# Patient Record
Sex: Male | Born: 1985 | Race: White | Hispanic: No | Marital: Single | Smoking: Former smoker
Health system: Southern US, Community
[De-identification: ages and names within clinical notes are randomized; demographics above are authoritative.]

## PROBLEM LIST (undated history)

## (undated) DIAGNOSIS — T7840XA Allergy, unspecified, initial encounter: Secondary | ICD-10-CM

## (undated) HISTORY — DX: Allergy, unspecified, initial encounter: T78.40XA

---

## 2004-01-13 ENCOUNTER — Emergency Department (HOSPITAL_COMMUNITY): Admission: EM | Admit: 2004-01-13 | Discharge: 2004-01-14 | Payer: Self-pay | Admitting: Emergency Medicine

## 2007-02-03 ENCOUNTER — Encounter: Admission: RE | Admit: 2007-02-03 | Discharge: 2007-02-03 | Payer: Self-pay | Admitting: Family Medicine

## 2011-12-29 ENCOUNTER — Ambulatory Visit (INDEPENDENT_AMBULATORY_CARE_PROVIDER_SITE_OTHER): Payer: BC Managed Care – PPO | Admitting: Family Medicine

## 2011-12-29 VITALS — BP 122/74 | HR 75 | Temp 98.0°F | Resp 16 | Ht 67.0 in | Wt 154.8 lb

## 2011-12-29 DIAGNOSIS — H6090 Unspecified otitis externa, unspecified ear: Secondary | ICD-10-CM

## 2011-12-29 DIAGNOSIS — L0291 Cutaneous abscess, unspecified: Secondary | ICD-10-CM

## 2011-12-29 DIAGNOSIS — M79669 Pain in unspecified lower leg: Secondary | ICD-10-CM

## 2011-12-29 DIAGNOSIS — H60399 Other infective otitis externa, unspecified ear: Secondary | ICD-10-CM

## 2011-12-29 DIAGNOSIS — M79609 Pain in unspecified limb: Secondary | ICD-10-CM

## 2011-12-29 DIAGNOSIS — L0390 Cellulitis, unspecified: Secondary | ICD-10-CM

## 2011-12-29 MED ORDER — OFLOXACIN 0.3 % OT SOLN: 10.0000 [drp] | Freq: Every day | OTIC | Status: AC

## 2011-12-29 MED ORDER — DOXYCYCLINE HYCLATE 100 MG PO CAPS: 100.0000 mg | ORAL_CAPSULE | Freq: Two times a day (BID) | ORAL | Status: AC

## 2011-12-29 NOTE — Progress Notes (Signed)
  Patient Name: Taylor Patton Date of Birth: 02/12/1986 Medical Record Number: [id number] Gender: male Date of Encounter: 12/29/2011  History of Present Illness:  Taylor Patton is a 26 y.o. very pleasant male patient who presents with the following:  Notes a lesion on his right anterior shin- this has been present for the last 2 or 3 days.  It is fairly tender- hurts a little bit to walk. 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  The area has gotten a good bit more painful and larger since yesterday.    Generally healthy except for a history of hearing issues.  He has an rx for ciprodex drops which he uses intermittently.  He feels that his left OE may be acting up.  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  Otherwise he is generally healthy and feels well- no fever, malaise, cough, ST, etc   There is no problem list on file for this patient.  No past medical history on file. No past surgical history on file. History  Substance Use Topics  . Smoking status: Former Games developer  . Smokeless tobacco: Not on file  . Alcohol Use: Not on file   No family history on file. No Known Allergies  Medication list has been reviewed and updated.  Review of Systems: As per HPI- otherwise negative.   Physical Examination: Filed Vitals:   12/29/11 1250  BP: 122/74  Pulse: 75  Temp: 98 F (36.7 C)  TempSrc: Oral  Resp: 16  Height: 5\' 7"  (1.702 m)  Weight: 154 lb 12.8 oz (70.217 kg)    Body mass index is 24.25 kg/(m^2).  Physical exam: GEN: WDWN, NAD, Non-toxic, A & O x 3 HEENT: Atraumatic, Normocephalic. Neck supple. No masses, No LAD.  Right ear/ TM wnl.  Left ear canal with debris and swelling which obscures the TM- however I do not see definite OM.  Ears and Nose: No external deformity. Respiratory effort and rate normal, pulse normal EXTR: No c/c/e NEURO Normal gait.  PSYCH: Normally  interactive. Conversant. Not depressed or anxious appearing.  Calm demeanor.  Left anterior shin: there is a nickel sized area on the medial side of the tibia- is is red and tender.  There is a small "head" but no pustule, no fluctuance.  Do not suspect an abscess that needs I and D at this time  Assessment and plan:  1. Cellulitis  doxycycline (VIBRAMYCIN) 100 MG capsule  2. Otitis externa  ofloxacin (FLOXIN OTIC) 0.3 % otic solution  3. Pain, lower leg  doxycycline (VIBRAMYCIN) 100 MG capsule   Explained that he likely has MRSA due to his time spent at a gym and the appearance/ behavior of his leg cellulitis.  Cover with doxy, use hot compresses. If not getting better please RTC as may need I and D.   floxin otic drops to use for one week for left OE.  Patient (or parent if minor) instructed to return to clinic or call if not better in 2 day(s).  Sooner if worse.

## 2012-01-01 ENCOUNTER — Telehealth: Payer: Self-pay

## 2012-01-01 NOTE — Telephone Encounter (Signed)
PT STATES THAT HE WAS DIAGNOSED WITH HAVING MRSA AND WAS GIVEN AN ANTIBIOTIC, SINCE THEN THE AREA HAS BEEN GETTING BETTER BUT HE IS NOW CONCERNED ABOUT A LYMPH NODE THAT HE BELIEVES IS INFLAMED IN HIS GROIN AREA AND WOULD LIKE TO KNOW IF ITS CAUSED BY HAVING MRSA.

## 2012-01-01 NOTE — Telephone Encounter (Signed)
That is your body's natural infection fighting response.

## 2012-01-01 NOTE — Telephone Encounter (Signed)
INFORMED PT OF MESSAGE. PT FEELS HE IS GETTING BETTER. HE IS TAKING THE ABX AND TOLD HIM TO RTC IF GETS ANY WORSE.

## 2012-01-03 ENCOUNTER — Ambulatory Visit (INDEPENDENT_AMBULATORY_CARE_PROVIDER_SITE_OTHER): Payer: BC Managed Care – PPO | Admitting: Internal Medicine

## 2012-01-03 VITALS — BP 115/65 | HR 65 | Temp 98.3°F | Resp 16 | Ht 66.5 in | Wt 158.8 lb

## 2012-01-03 DIAGNOSIS — L0390 Cellulitis, unspecified: Secondary | ICD-10-CM

## 2012-01-03 DIAGNOSIS — S81009A Unspecified open wound, unspecified knee, initial encounter: Secondary | ICD-10-CM

## 2012-01-03 DIAGNOSIS — R599 Enlarged lymph nodes, unspecified: Secondary | ICD-10-CM

## 2012-01-03 DIAGNOSIS — L089 Local infection of the skin and subcutaneous tissue, unspecified: Secondary | ICD-10-CM

## 2012-01-03 DIAGNOSIS — R591 Generalized enlarged lymph nodes: Secondary | ICD-10-CM

## 2012-01-03 NOTE — Progress Notes (Signed)
   Patient ID: Taylor Patton MRN: [id number], DOB: Jul 18, 1986, 26 y.o. Date of Encounter: 01/03/2012, 12:35 PM    PROCEDURE NOTE: Verbal consent obtained. Betadine prep per usual protocol. Local anesthesia obtained with 1% lidocaine with epi 2 cc.  1cm incision made with 11 blade along lesion.  Culture taken previously by Dr. Vic Ripper amount of purulence expressed. Wound debrided. Lesion explored revealing no loculations. Irrigated with NS Packed with 1/4 inch plain packing Dressed. Wound care instructions including precautions with patient. Patient tolerated the procedure well. Recheck in 48 hours.     SignedEula Listen, PA-C 01/03/2012 12:35 PM

## 2012-01-03 NOTE — Progress Notes (Signed)
  Subjective:    Patient ID: Taylor Patton, male    DOB: 1986-06-19, 26 y.o.   MRN: [id number]  HPI Pain and drainage from infected spider or insect bite here for followup feels less ill/ no fever or chills /no nausea or vomiting /but feels continued pain 5/10. Has continued drainage and swelling of the lower leg with drainage form the wound site.also has swollen lymph nodes in his groin. Compliant with the antibiotic taking doxycycline   Review of Systems  Constitutional: Negative.   HENT: Negative.   Skin: Positive for wound.       Left anterior leg  All other systems reviewed and are negative.       Objective:   Physical Exam  Nursing note and vitals reviewed. Constitutional: He is oriented to person, place, and time. He appears well-developed and well-nourished.  HENT:  Head: Normocephalic and atraumatic.  Right Ear: External ear normal.  Left Ear: External ear normal.  Nose: Nose normal.  Mouth/Throat: Oropharynx is clear and moist.  Eyes: Conjunctivae and EOM are normal. Pupils are equal, round, and reactive to light.  Neck: Normal range of motion. Neck supple.  Cardiovascular: Normal rate and regular rhythm.   Pulmonary/Chest: Effort normal and breath sounds normal.  Abdominal: Soft.  Musculoskeletal: He exhibits tenderness.       Swelling tenderness of the right lower leg anterior tibial area mid leg  Neurological: He is alert and oriented to person, place, and time. He has normal reflexes.  Skin:       3cm area of induratoin and erythema with a central area of necrosis with puss coming from the central area. There is dead skin in the surrounding area and induration and fluctuance of the central are. Small amount of pus expressed and culture done.  Psychiatric: He has a normal mood and affect. His behavior is normal. Judgment and thought content normal.          Assessment & Plan:  Infected bite suspect spider bite  With central area of necrosis. Plan i and d  and cultures. Continue antibiotic and analgesics.

## 2012-01-03 NOTE — Patient Instructions (Signed)
Warm compresses. Dressing changes as directed. Continue and complete the antibiotics. otc Advil as directed for pain.

## 2012-01-05 ENCOUNTER — Ambulatory Visit (INDEPENDENT_AMBULATORY_CARE_PROVIDER_SITE_OTHER): Payer: BC Managed Care – PPO | Admitting: Physician Assistant

## 2012-01-05 VITALS — BP 106/70 | HR 72 | Temp 98.6°F | Resp 16 | Ht 66.25 in | Wt 156.0 lb

## 2012-01-05 DIAGNOSIS — L0390 Cellulitis, unspecified: Secondary | ICD-10-CM

## 2012-01-05 DIAGNOSIS — L0291 Cutaneous abscess, unspecified: Secondary | ICD-10-CM

## 2012-01-05 NOTE — Progress Notes (Signed)
  Subjective:    Patient ID: Taylor Patton, male    DOB: [birth year], 26 y.o.   MRN: [id number]  HPI Taylor Patton comes in today for wound recheck s/p I & D, right anterior lower leg on 01/03/12.  He is almost finished with the antibiotic and is tolerating it well.  He attempted to remove the bandage and saw that the packing was coming out so he stopped.  He has no increased in pain, fever or chills are new redness or swelling.     Review of Systems As above     Objective:   Physical Exam Right lower leg  Anterior aspect with intact packing which is removed easily.  Some purulent drainage noted on gauze.  Tender along incision borders but no active drainage noted although it remains indurated.  Irrigated with Lidocaine 2% plain 3 cc.  Repacked with 1/4" plain.  Cleansed and dressed.       Assessment & Plan:  Abscess, preliminary culture report shows moderate staph aureus  Continue antibiotic.  Reassess need to extend abx at next wound recheck on Friday.  Wound care reviewed.  He will return in 48 hours for wound recheck.

## 2012-01-06 LAB — WOUND CULTURE: Gram Stain: NONE SEEN

## 2012-01-07 ENCOUNTER — Ambulatory Visit (INDEPENDENT_AMBULATORY_CARE_PROVIDER_SITE_OTHER): Payer: BC Managed Care – PPO | Admitting: Physician Assistant

## 2012-01-07 ENCOUNTER — Telehealth: Payer: Self-pay

## 2012-01-07 ENCOUNTER — Encounter: Payer: Self-pay | Admitting: Physician Assistant

## 2012-01-07 VITALS — BP 126/78 | HR 76 | Temp 98.6°F | Resp 16 | Ht 66.5 in | Wt 156.0 lb

## 2012-01-07 DIAGNOSIS — L02419 Cutaneous abscess of limb, unspecified: Secondary | ICD-10-CM

## 2012-01-07 DIAGNOSIS — L03119 Cellulitis of unspecified part of limb: Secondary | ICD-10-CM

## 2012-01-07 NOTE — Progress Notes (Signed)
   Patient ID: Taylor Patton MRN: [id number], DOB: 25-Feb-1986 26 y.o. Date of Encounter: 01/07/2012, 1:39 PM  Primary Physician: No primary provider on file.  Chief Complaint: Wound care   See previous note  HPI: 26 y.o. y/o male presents for wound care s/p I&D on 01/03/12. Doing well No issues or complaints Afebrile/ no chills No nausea or vomiting Tolerating Doxycycline Pain improving Daily dressing change Previous note reviewed  No past medical history on file.   Home Meds: Prior to Admission medications   Medication Sig Start Date End Date Taking? Authorizing Provider  doxycycline (VIBRAMYCIN) 100 MG capsule Take 1 capsule (100 mg total) by mouth 2 (two) times daily. 12/29/11 01/08/12 Yes Jessica C Copland, MD  ofloxacin (FLOXIN OTIC) 0.3 % otic solution Place 10 drops into the right ear daily. 12/29/11 01/08/12 Yes Gwenlyn Found Copland, MD    Allergies: No Known Allergies  ROS: Constitutional: Afebrile, no chills Cardiovascular: negative for chest pain or palpitations Dermatological: Positive for wound. Negative for erythema, or warmth  GI: No nausea or vomiting   EXAM: Physical Exam: Blood pressure 126/78, pulse 76, temperature 98.6 F (37 C), temperature source Oral, resp. rate 16, height 5' 6.5" (1.689 m), weight 156 lb (70.761 kg), SpO2 100.00%., Body mass index is 24.80 kg/(m^2). General: Well developed, well nourished, in no acute distress. Nontoxic appearing. Head: Normocephalic, atraumatic, sclera non-icteric.  Neck: Supple. Lungs: Breathing is unlabored. Heart: Normal rate. Skin:  Warm and moist. Dressing and packing in place. Mild local induration. No erythema, or tenderness to palpation. Neuro: Alert and oriented X 3. Moves all extremities spontaneously. Normal gait.  Psych:  Responds to questions appropriately with a normal affect.   PROCEDURE: Dressing and packing removed. No purulence expressed Wound bed healthy Irrigated with 1% plain lidocaine 5  cc. Repacked with 1/4 inch plain packing Dressing applied  LAB: Culture: MRSA  A/P: 26 y.o. y/o male with improving cellulitis/abscess as above s/p I&D on 01/03/12 -Wound care per above -Continue Doxycycline -Pain well controlled -Daily dressing changes -Recheck 48 hours  Signed, Eula Listen, PA-C 01/07/2012 1:39 PM

## 2012-01-07 NOTE — Telephone Encounter (Signed)
Taylor Patton  Pt will run out of antibiotics prior to returning on Sunday.  Does he need to continue taking them?  Please call patient

## 2012-01-08 NOTE — Telephone Encounter (Signed)
[facility] message from [place]

## 2012-01-08 NOTE — Telephone Encounter (Signed)
As long as he is still improving, it is ok for him to be finished with them and we can assess the need for more tomorrow

## 2012-01-09 ENCOUNTER — Ambulatory Visit (INDEPENDENT_AMBULATORY_CARE_PROVIDER_SITE_OTHER): Payer: BC Managed Care – PPO | Admitting: Family Medicine

## 2012-01-09 VITALS — BP 114/66 | HR 84 | Temp 97.9°F | Resp 16 | Ht 66.0 in | Wt 154.8 lb

## 2012-01-09 DIAGNOSIS — L03119 Cellulitis of unspecified part of limb: Secondary | ICD-10-CM

## 2012-01-09 DIAGNOSIS — L02415 Cutaneous abscess of right lower limb: Secondary | ICD-10-CM

## 2012-01-09 DIAGNOSIS — L02419 Cutaneous abscess of limb, unspecified: Secondary | ICD-10-CM

## 2012-01-09 NOTE — Progress Notes (Signed)
26 year old man here for followup of right shin abscess.  Objective: The wound is now nontender with a 4 mm opening, clean base, and no surrounding cellulitis.   assessment wound is healing nicely  Plan: Finish doxycycline continue wash with soap and water and cover his lungs there is an open sore.

## 2012-01-11 ENCOUNTER — Ambulatory Visit (INDEPENDENT_AMBULATORY_CARE_PROVIDER_SITE_OTHER): Payer: BC Managed Care – PPO | Admitting: Physician Assistant

## 2012-01-11 VITALS — BP 111/66 | HR 69 | Temp 98.4°F | Resp 18 | Ht 66.5 in | Wt 152.2 lb

## 2012-01-11 DIAGNOSIS — L03119 Cellulitis of unspecified part of limb: Secondary | ICD-10-CM

## 2012-01-11 DIAGNOSIS — L02419 Cutaneous abscess of limb, unspecified: Secondary | ICD-10-CM

## 2012-01-11 NOTE — Progress Notes (Signed)
   Patient ID: Taylor Patton MRN: [id number], DOB: 12-27-1985 26 y.o. Date of Encounter: 01/11/2012, 2:33 PM  Primary Physician: No primary provider on file.  Chief Complaint: Wound care   See previous note  HPI: 26 y.o. y/o male presents for wound care s/p I&D on 01/03/12 Doing well No issues or complaints Afebrile/ no chills No nausea or vomiting Tolerating Doxycycline No pain Daily dressing change Previous note reviewed Told at last office visit that wound was resolved, patient concerned as there still appears to be some mild swelling.   No past medical history on file.   Home Meds: Prior to Admission medications   Medication Sig Start Date End Date Taking? Authorizing Provider  doxycycline (VIBRAMYCIN) 100 MG capsule Take 100 mg by mouth 2 (two) times daily.   Yes Historical Provider, MD  ofloxacin (FLOXIN) 0.3 % otic solution Place 5 drops into the left ear daily.    Historical Provider, MD    Allergies: No Known Allergies  ROS: Constitutional: Afebrile, no chills Cardiovascular: negative for chest pain or palpitations Dermatological: Positive for wound. Negative for erythema, pain, or warmth  GI: No nausea or vomiting   EXAM: Physical Exam: Blood pressure 111/66, pulse 69, temperature 98.4 F (36.9 C), temperature source Oral, resp. rate 18, height 5' 6.5" (1.689 m), weight 152 lb 3.2 oz (69.037 kg), SpO2 99.00%., Body mass index is 24.20 kg/(m^2). General: Well developed, well nourished, in no acute distress. Nontoxic appearing. Head: Normocephalic, atraumatic, sclera non-icteric.  Neck: Supple. Lungs: Breathing is unlabored. Heart: Normal rate. Skin:  Warm and moist. Dressing and packing in place. Mild local induration, without fluctuance, erythema, or tenderness to palpation. Neuro: Alert and oriented X 3. Moves all extremities spontaneously. Normal gait.  Psych:  Responds to questions appropriately with a normal affect.    LAB: Culture:  MRSA  A/P: 26 y.o. y/o male with resolving cellulitis/abscess as above s/p I&D on 01/03/12 -Wound care per above -Continue Doxycycline -No pain -Daily dressing changes -Recheck 3-4 days -Warm compresses -RTC precautions  Signed, Eula Listen, PA-C 01/11/2012 2:33 PM

## 2012-01-14 ENCOUNTER — Ambulatory Visit (INDEPENDENT_AMBULATORY_CARE_PROVIDER_SITE_OTHER): Payer: BC Managed Care – PPO | Admitting: Physician Assistant

## 2012-01-14 VITALS — BP 127/74 | HR 69 | Temp 98.0°F | Resp 16 | Ht 66.5 in | Wt 152.8 lb

## 2012-01-14 DIAGNOSIS — J309 Allergic rhinitis, unspecified: Secondary | ICD-10-CM

## 2012-01-14 DIAGNOSIS — L03119 Cellulitis of unspecified part of limb: Secondary | ICD-10-CM

## 2012-01-14 MED ORDER — MOMETASONE FUROATE 50 MCG/ACT NA SUSP: 2.0000 | Freq: Every day | NASAL | Status: AC

## 2012-01-14 MED ORDER — DOXYCYCLINE HYCLATE 100 MG PO CAPS: 100.0000 mg | ORAL_CAPSULE | Freq: Two times a day (BID) | ORAL | Status: AC

## 2012-01-14 NOTE — Progress Notes (Signed)
   Patient ID: Taylor Patton MRN: [id number], DOB: 06/04/1986 26 y.o. Date of Encounter: 01/14/2012, 1:40 PM  Primary Physician: No primary provider on file.  Chief Complaint: Wound care   See previous note  HPI: 26 y.o. y/o male presents for wound care s/p I&D on 01/03/12 Doing well No issues or complaints Afebrile/ no chills No nausea or vomiting Tolerating Doxycycline Pain resolved Previous note reviewed  He also mentions worsening of his allergies over the past couple of weeks. Restarted his Claritin 5 days prior and requests some help with this today.  No past medical history on file.   Home Meds: Prior to Admission medications   Medication Sig Start Date End Date Taking? Authorizing Provider  doxycycline (VIBRAMYCIN) 100 MG capsule Take 100 mg by mouth 2 (two) times daily.   Yes Historical Provider, MD  loratadine (CLARITIN) 10 MG tablet Take 10 mg by mouth daily.   Yes Historical Provider, MD  doxycycline (VIBRAMYCIN) 100 MG capsule Take 1 capsule (100 mg total) by mouth 2 (two) times daily. 01/14/12 01/24/12  Jermain Curt M Alizandra Loh, PA-C  mometasone (NASONEX) 50 MCG/ACT nasal spray Place 2 sprays into the nose daily. 01/14/12 01/13/13  Lister Brizzi M Kashira Behunin, PA-C  ofloxacin (FLOXIN) 0.3 % otic solution Place 5 drops into the left ear daily.    Historical Provider, MD    Allergies: No Known Allergies  ROS: Constitutional: Afebrile, no chills Cardiovascular: negative for chest pain or palpitations Dermatological: Positive for wound. Negative for erythema, pain, or warmth  GI: No nausea or vomiting   EXAM: Physical Exam: Blood pressure 127/74, pulse 69, temperature 98 F (36.7 C), temperature source Oral, resp. rate 16, height 5' 6.5" (1.689 m), weight 152 lb 12.8 oz (69.31 kg), SpO2 100.00%., Body mass index is 24.29 kg/(m^2). General: Well developed, well nourished, in no acute distress. Nontoxic appearing. Head: Normocephalic, atraumatic, sclera non-icteric.  Neck: Supple. Lungs:  Breathing is unlabored. Heart: Normal rate. Skin:  Warm and moist. Dressing and packing in place. No induration, erythema, or tenderness to palpation. Neuro: Alert and oriented X 3. Moves all extremities spontaneously. Normal gait.  Psych:  Responds to questions appropriately with a normal affect.    LAB: Culture: MRSA  A/P: 26 y.o. y/o male with resolved cellulitis/abscess as above s/p I&D on 01/03/12 and allergic rhinitis -Doxycycline 100 mg 1 po bid 320 no RF -No pain -Resolved -Nasonex #1 As directed RF 12  Signed, Eula Listen, PA-C 01/14/2012 1:40 PM

## 2012-01-25 NOTE — Telephone Encounter (Signed)
PT STATES HE HAD CALLED FOR RYAN ABOUT THE MRSA HE WAS DIAGNOSED WITH AND HADN'T HEARD FROM HIM. PT WAS TOLD THAT RYAN WILL BE IN ON Thursday AND HE WANTED TO WAIT AND RECEIVE A CALL FROM RYAN ONLY. PLEASE CALL [phone]

## 2012-01-26 NOTE — Telephone Encounter (Signed)
LMOM

## 2012-01-26 NOTE — Telephone Encounter (Signed)
Spoke with patient. Had a question about small amount of firmness. Advised continue warm compresses and call back in 10 days if no better.

## 2012-03-04 ENCOUNTER — Ambulatory Visit (INDEPENDENT_AMBULATORY_CARE_PROVIDER_SITE_OTHER): Payer: BC Managed Care – PPO | Admitting: Emergency Medicine

## 2012-03-04 VITALS — BP 118/84 | HR 67 | Temp 97.6°F | Resp 16 | Ht 66.5 in | Wt 156.6 lb

## 2012-03-04 DIAGNOSIS — H66009 Acute suppurative otitis media without spontaneous rupture of ear drum, unspecified ear: Secondary | ICD-10-CM

## 2012-03-04 DIAGNOSIS — H6693 Otitis media, unspecified, bilateral: Secondary | ICD-10-CM

## 2012-03-04 NOTE — Progress Notes (Signed)
  Subjective:    Patient ID: Taylor Patton, male    DOB: 07/08/1986, 26 y.o.   MRN: [id number]  Otalgia  There is pain in the left ear. This is a recurrent problem. The current episode started in the past 7 days. The problem occurs constantly. The problem has been unchanged. There has been no fever. The pain is mild. Associated symptoms include hearing loss. Pertinent negatives include no abdominal pain, coughing, diarrhea, drainage, ear discharge, headaches, neck pain, rash, rhinorrhea, sore throat or vomiting. He has tried nothing for the symptoms. His past medical history is significant for hearing loss and a tympanostomy tube. There is no history of a chronic ear infection.      Review of Systems  HENT: Positive for hearing loss and ear pain. Negative for sore throat, rhinorrhea, neck pain and ear discharge.   Respiratory: Negative for cough.   Gastrointestinal: Negative for vomiting, abdominal pain and diarrhea.  Skin: Negative for rash.  Neurological: Negative for headaches.  All other systems reviewed and are negative.       Objective:   Physical Exam  Constitutional: He appears well-developed and well-nourished.  HENT:  Head: Normocephalic and atraumatic.  Right Ear: Tympanic membrane is perforated.  Left Ear: Tympanic membrane is perforated.  Eyes: Conjunctivae are normal. Pupils are equal, round, and reactive to light.  Neck: Normal range of motion. Neck supple.  Cardiovascular: Normal rate.   Pulmonary/Chest: Effort normal.  Abdominal: Soft.          Assessment & Plan:  Bilateral pe tubes Concern about motion sickness drammamine

## 2012-09-17 ENCOUNTER — Ambulatory Visit: Payer: BC Managed Care – PPO

## 2012-09-17 ENCOUNTER — Ambulatory Visit (INDEPENDENT_AMBULATORY_CARE_PROVIDER_SITE_OTHER): Payer: BC Managed Care – PPO | Admitting: Family Medicine

## 2012-09-17 VITALS — BP 115/75 | HR 77 | Temp 98.5°F | Resp 17 | Ht 66.5 in | Wt 159.0 lb

## 2012-09-17 DIAGNOSIS — R0789 Other chest pain: Secondary | ICD-10-CM

## 2012-09-17 DIAGNOSIS — R071 Chest pain on breathing: Secondary | ICD-10-CM

## 2012-09-17 MED ORDER — HYDROCODONE-ACETAMINOPHEN 5-300 MG PO TABS: ORAL_TABLET | ORAL | Status: AC

## 2012-09-17 NOTE — Patient Instructions (Signed)
rest your chest wall until it is feeling better. If things are getting worse, return. Things to watch out for would include coughing up blood or increased shortness of breath, or accelerating pain. He is to return here or go to the emergency room.

## 2012-09-17 NOTE — Progress Notes (Addendum)
Subjective: 27 year old who was doing some things in his gym today. He is feeling jumping up onto some boxes. He is done the maneuver safely other times, but this time he didn't didn't land securely and fell about 5 feet. He landed on his left rib cage laterally, also hitting his knee some. He is already prior to this been having problems with his left arm, with a reticular pain and numbness in his thumb and index finger on the left side. He has seen a chiropractor friend who has x-rays neck. They felt like it was probably bones pinching some on the nerve to irritated. Did not feel like the disc look at. The chiropractor's been doing some work with him. Not on any regular medications for it or any other regular meds except allergy meds.  Objective: Alert oriented young man in no major distress. He has not had any hemoptysis. He does have some discomfort when he breathes. His chest is clear to auscultation. Heart regular. His lateral lower rib cage is tender. He felt like it seemed a little out of line on the far lateral aspect at about T. 10, but I do not feel any obvious bony abnormality. He is tender. Most of the tenderness is at the distal end of the lower ribs. His grip seems good, and function of the arm is normal. He doesn't seem to have is much of the radial nerve symptoms today.  Assessment: Chest wall pain Left radial neuropathy  Plan: Left rib series, then decide treatment  [facility] reading (PRIMARY) by  Dr. Alwyn Ren No fracture seen .

## 2013-08-06 ENCOUNTER — Ambulatory Visit (INDEPENDENT_AMBULATORY_CARE_PROVIDER_SITE_OTHER): Payer: BC Managed Care – PPO | Admitting: Sports Medicine

## 2013-08-06 ENCOUNTER — Encounter: Payer: Self-pay | Admitting: Sports Medicine

## 2013-08-06 VITALS — BP 120/74 | Ht 66.5 in | Wt 153.0 lb

## 2013-08-06 DIAGNOSIS — M25519 Pain in unspecified shoulder: Secondary | ICD-10-CM

## 2013-08-06 DIAGNOSIS — M25512 Pain in left shoulder: Secondary | ICD-10-CM

## 2013-08-06 NOTE — Progress Notes (Signed)
   Subjective:    Patient ID: Taylor Patton, male    DOB: 1986-06-01, 27 y.o.   MRN: [id number]  HPI  Pt presents to clinic for evaluation of Lt shoulder pain x 1 yr.  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   Initially had tingling down lt arm and pain, tingling subsided about 9 months ago.   Seeing Damien Rudolofo for ART. Trying home stretching recommended by [facility], and massage. He has also used acupuncture Still doing daily crossfit workouts, although most upper body exercises cause shoulder pain.    He notes that the tingling he had in his left arm has resolved with treatment However, he still has specific week positions for the shoulder Ultimately if he tries certain overhead lifts  Review of Systems     Objective:   Physical Exam Muscular white male in no acute distress  Lt lower rib cage protrudes more than rt Lt arm slightly smaller than rt  L > R scapular winging on back scratch Lt scapular winging on wall push up   Lt shoulder exam: Empty can negative  Weak on O'Brien's and pain Clicking on crank test, more stable with assistance  Speeds test and rotated biceps stress test both reveal weakness and significant pain       Assessment & Plan:

## 2013-08-06 NOTE — Assessment & Plan Note (Signed)
I strongly suspect he has a SLAP tear  At 27 and with his occupation as a Marketing executive I think is a good candidate for repair if that is the case  I will send him to Dr. Dion Saucier for an opinion and probable MRA

## 2013-08-06 NOTE — Patient Instructions (Addendum)
Your exam is positive for superior labral tear in your left shoulder  You have been scheduled for an appointment with Dr. Dion Saucier at [facility] on 08/07/13 at 2pm  Their office is located at [address]                                          [place], [id number]  [phone]

## 2013-08-07 ENCOUNTER — Other Ambulatory Visit: Payer: Self-pay | Admitting: Orthopedic Surgery

## 2013-08-07 DIAGNOSIS — M25511 Pain in right shoulder: Secondary | ICD-10-CM

## 2013-08-21 ENCOUNTER — Other Ambulatory Visit: Payer: Self-pay | Admitting: Orthopedic Surgery

## 2013-08-21 DIAGNOSIS — M25512 Pain in left shoulder: Secondary | ICD-10-CM

## 2013-08-22 ENCOUNTER — Ambulatory Visit
Admission: RE | Admit: 2013-08-22 | Discharge: 2013-08-22 | Disposition: A | Discharge status: Home / Self Care | Payer: BC Managed Care – PPO | Source: Ambulatory Visit | Attending: Orthopedic Surgery | Admitting: Orthopedic Surgery

## 2013-08-22 ENCOUNTER — Other Ambulatory Visit: Payer: Self-pay | Admitting: Orthopedic Surgery

## 2013-08-22 DIAGNOSIS — M25512 Pain in left shoulder: Secondary | ICD-10-CM

## 2013-08-22 DIAGNOSIS — M25511 Pain in right shoulder: Secondary | ICD-10-CM

## 2013-08-22 MED ORDER — IOHEXOL 180 MG/ML  SOLN
15.0000 mL | Freq: Once | INTRAMUSCULAR | Status: AC | PRN
  Administered 2013-08-22: 15 mL via INTRA_ARTICULAR

## 2017-06-28 ENCOUNTER — Other Ambulatory Visit: Payer: Self-pay | Admitting: Otolaryngology

## 2017-06-28 DIAGNOSIS — H7190 Unspecified cholesteatoma, unspecified ear: Secondary | ICD-10-CM

## 2018-10-04 ENCOUNTER — Other Ambulatory Visit: Payer: Self-pay | Admitting: Otolaryngology

## 2018-10-04 DIAGNOSIS — H7102 Cholesteatoma of attic, left ear: Secondary | ICD-10-CM

## 2018-10-19 ENCOUNTER — Inpatient Hospital Stay: Admission: RE | Admit: 2018-10-19 | Payer: Self-pay | Source: Ambulatory Visit

## 2018-10-19 ENCOUNTER — Ambulatory Visit
Admission: RE | Admit: 2018-10-19 | Discharge: 2018-10-19 | Disposition: A | Discharge status: Home / Self Care | Payer: BLUE CROSS/BLUE SHIELD | Source: Ambulatory Visit | Attending: Otolaryngology | Admitting: Otolaryngology

## 2018-10-19 DIAGNOSIS — H7102 Cholesteatoma of attic, left ear: Secondary | ICD-10-CM

## 2019-11-27 IMAGING — CT CT TEMPORAL BONES W/O CM
1 series · 14 of 30 positions shown, 18 images · non-contrast
Comparison: Brain MRI 02/03/2007.

CLINICAL DATA: 32-year-old male with progressive chronic left side
hearing loss. Cholesteatoma of the left addict. History of
tympanostomy tubes.

EXAM:
CT TEMPORAL BONES WITHOUT CONTRAST
TECHNIQUE: Axial and coronal plane CT imaging of the petrous temporal bones was
performed with thin-collimation image reconstruction. No intravenous
contrast was administered. Multiplanar CT image reconstructions were
also generated.

[Series 4: soft tissue · axial · 0.38mm/px · z∈[-162,-82]mm · 14 of 46 slices shown, 18 images]
[im 4/46  brain]
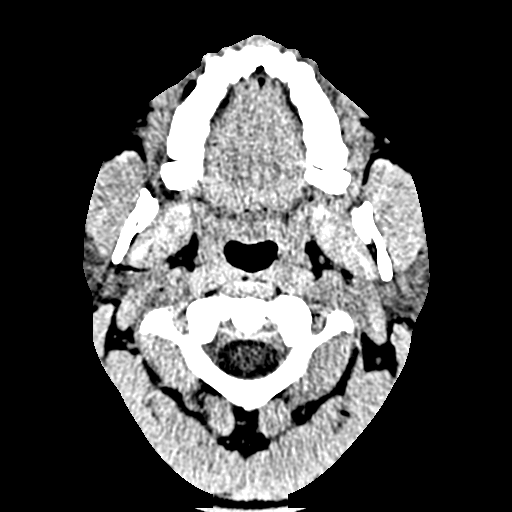
[im 4/46  bone]
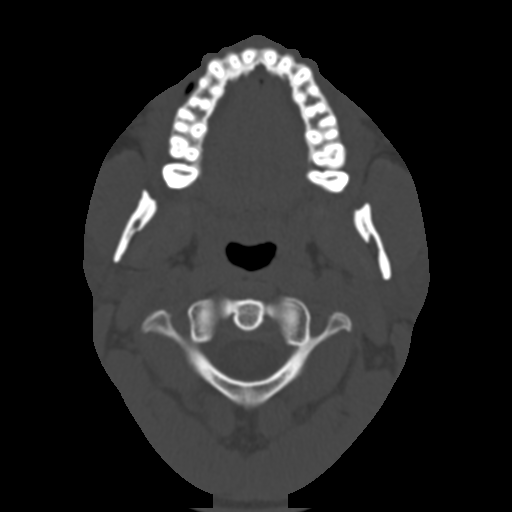
[im 7/46  bone]
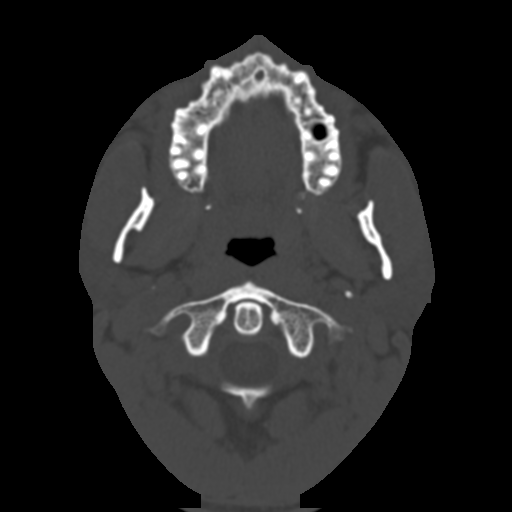
[im 10/46  bone]
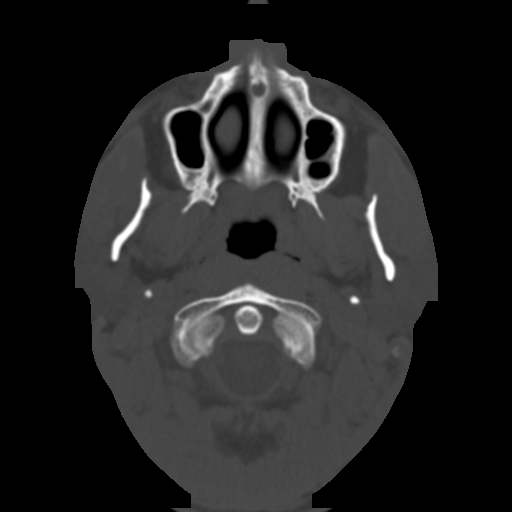
[im 13/46  bone]
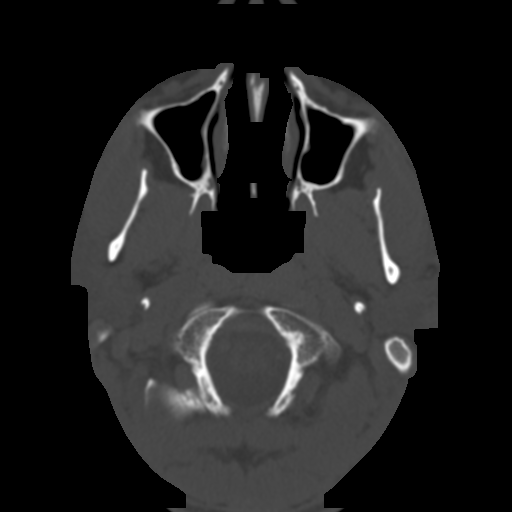
[im 16/46  brain]
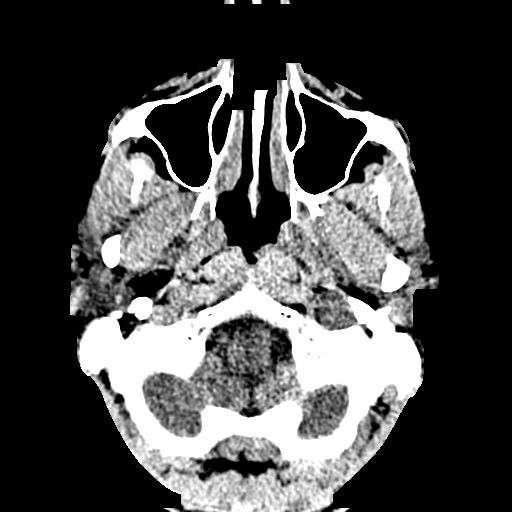
[im 16/46  bone]
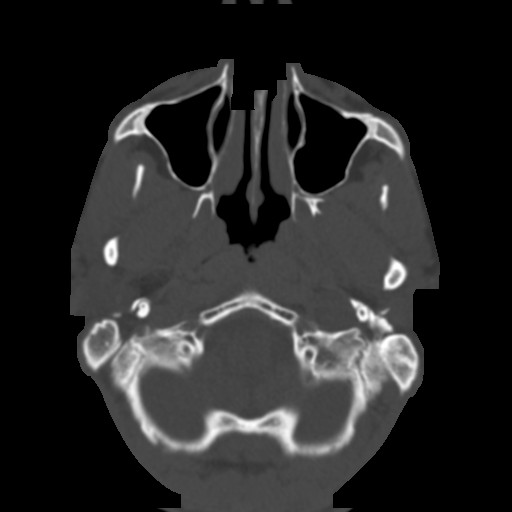
[im 19/46  bone]
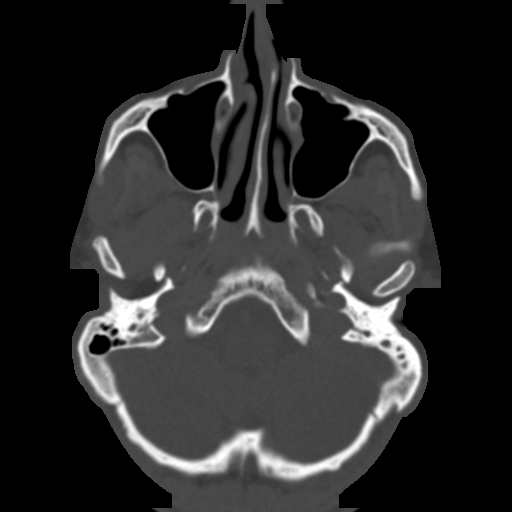
[im 22/46  bone]
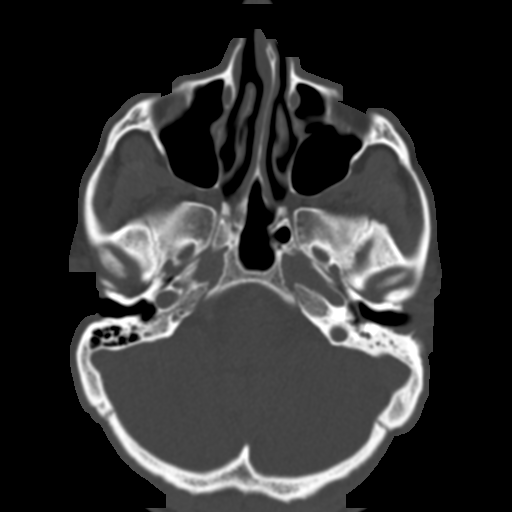
[im 25/46  bone]
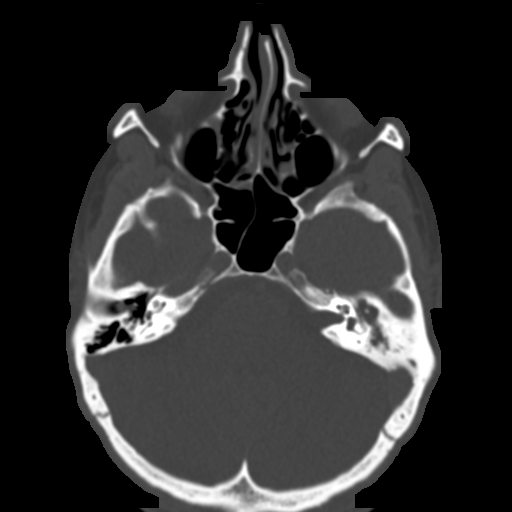
[im 28/46  brain]
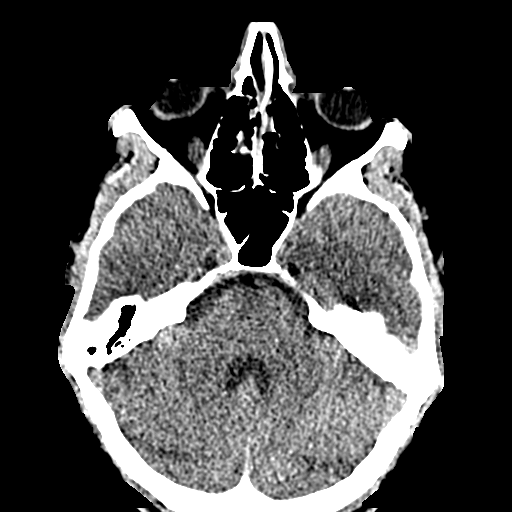
[im 28/46  bone]
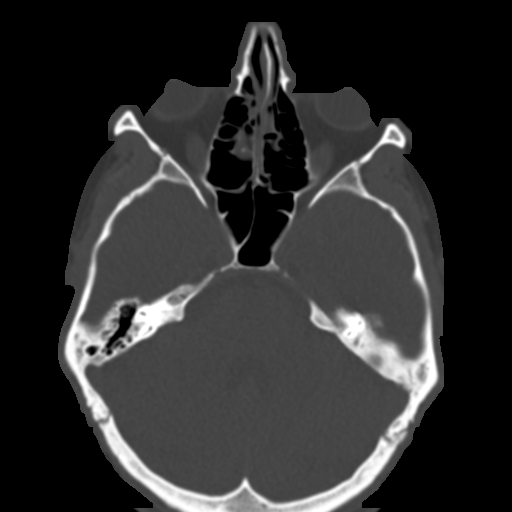
[im 32/46  bone]
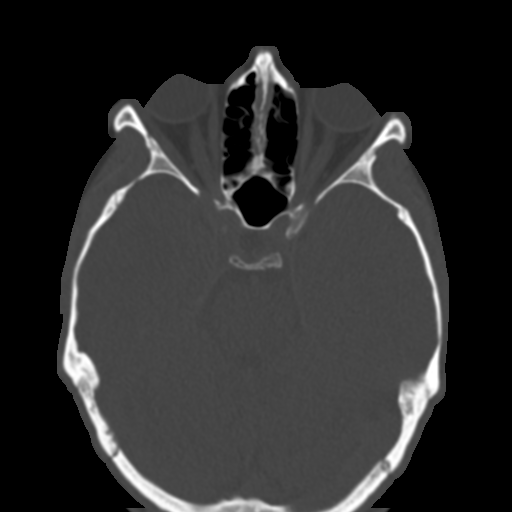
[im 35/46  bone]
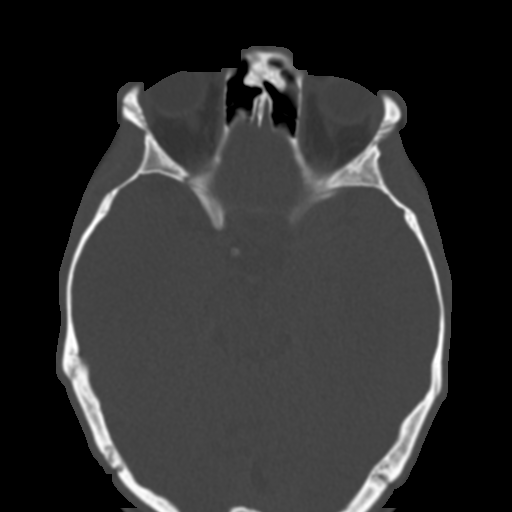
[im 38/46  bone]
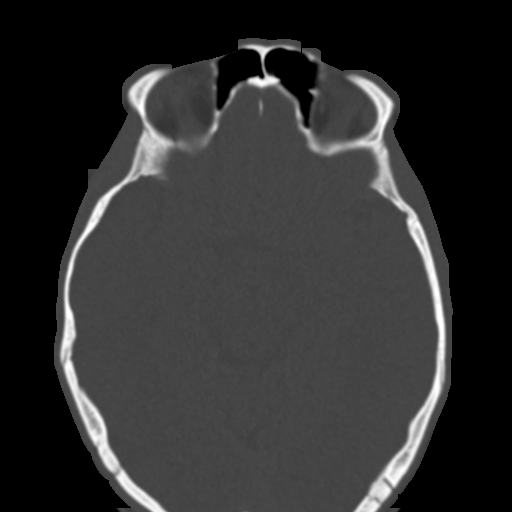
[im 41/46  brain]
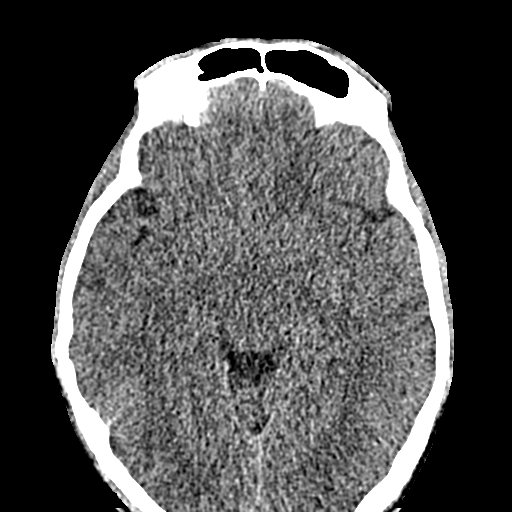
[im 41/46  bone]
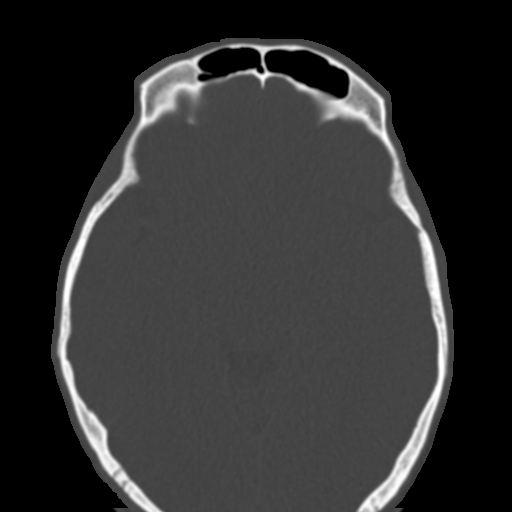
[im 44/46  bone]
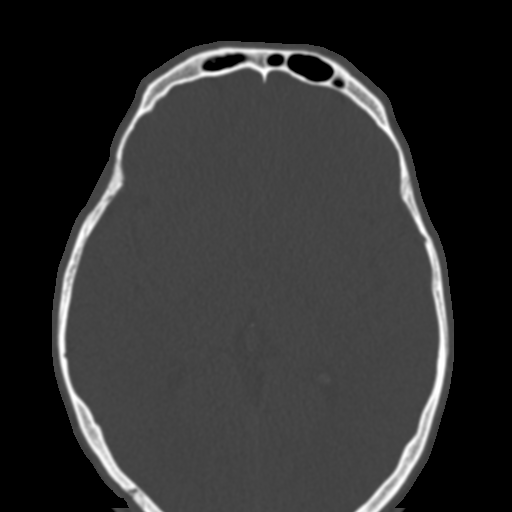

[14 of 30 positions shown; findings below may reference images not displayed]

FINDINGS: Stable and negative visible noncontrast brain parenchyma.

Visualized orbits and scalp soft tissues are within normal limits.
Negative visible noncontrast deep soft tissue spaces of the face.

Scattered mild bilateral paranasal sinus mucosal thickening
primarily involves the ethmoids and is mildly progressed compared to
5778. No sinus fluid level.

In general skull base mineralization is normal.

RIGHT TEMPORAL BONE:

Spanning both the medial aspect of the right EAC, the superior
tympanic membrane, and occupying left Prussak's space is a stellate
and globular opacity best demonstrated on series 9, image 100
encompassing about 8-9 millimeters. This surrounds the scutum and
abuts the ossicles with suspected mild bony erosion. Axial images
demonstrate the globular opacity continues into the right mastoid
antrum (series 6, image 88).

Below the level of the opacity the right TM appears normal. The
right meso- and hypotympanum remain clear. The ossicles are normally
aligned.

There is mild chronic sclerosis of the right mastoid air cells which
are otherwise well pneumatized.

The right IAC, cochlea, vestibule and vestibular aqueduct appear
normal. Right semicircular canals and the course of the right 7th
nerve are normal.

LEFT TEMPORAL BONE:

Circumferential abnormal soft tissue thickening throughout the left
EAC, more apparent along the posterior and superior walls as seen on
series 10, image 107 and series 5, image 70.

Confluent opacity throughout the left epitympanum from Prussak's
space throughout the addict and tracking inferiorly along what is
probably a severely thickened tympanic membrane. See series 10,
image 104.

The scutum and ossicles are subtotally eroded. The stapes is faintly
apparent on series 5, image 78, but ossicular alignment is not
confirmed.

Completely opacified left mastoid antrum contiguous with the attic
opacity, and mix of opacified and sclerosed mastoid air cells
throughout. No normal mastoid aeration.

The left IAC, cochlea, vestibule and vestibular aqueduct remain
within normal limits. The left semicircular canals are normal. The
course of the left 7th nerve appears preserved.
IMPRESSION: 1. Circumferential soft tissue thickening throughout the left EAC is
contiguous with confluent opacity throughout the left epitympanum
suspected to be cholesteatoma, with superimposed severe thickening
of the left TM and subtotal erosion of the ossicles and scutum. The
confluent opacity continues into the left mastoid antrum with
combined opacified and sclerosed mastoids.

2. On the right side there is a stellate and globular opacity
tracking from the epitympanum and Prussak's space through the
superior attachment of the right TM into the right EAC. This is also
suspicious for cholesteatoma, as mild erosion of the ossicles is
suspected. This opacity also continues into the right mastoid
antrum, but with surrounding air cells well pneumatized.

## 2019-12-13 ENCOUNTER — Ambulatory Visit: Payer: BC Managed Care – PPO | Attending: Internal Medicine

## 2019-12-13 DIAGNOSIS — Z23 Encounter for immunization: Secondary | ICD-10-CM

## 2019-12-13 NOTE — Progress Notes (Signed)
   Covid-19 Vaccination Clinic  Name:  Taylor Patton    MRN: [id number] DOB: [birth year]  12/13/2019  [name] was observed post Covid-19 immunization for 15 minutes without incident. He was provided with Vaccine Information Sheet and instruction to access the V-Safe system.   [name] was instructed to call 911 with any severe reactions post vaccine: Marland Kitchen Difficulty breathing  . Swelling of face and throat  . A fast heartbeat  . A bad rash all over body  . Dizziness and weakness   Immunizations Administered    Name Date Dose VIS Date Route   Pfizer COVID-19 Vaccine 12/13/2019 11:08 AM 0.3 mL 10/10/2018 Intramuscular   Manufacturer: [employer], [employer]   Lot: [id number]   NDC: [address]-8

## 2020-01-07 ENCOUNTER — Ambulatory Visit: Payer: BC Managed Care – PPO | Attending: Internal Medicine

## 2020-01-07 DIAGNOSIS — Z23 Encounter for immunization: Secondary | ICD-10-CM

## 2020-01-07 NOTE — Progress Notes (Signed)
   Covid-19 Vaccination Clinic  Name:  Cloud Graham Butkus    MRN: [id number] DOB: [birth year]  01/07/2020  Mr. Mccomb was observed post Covid-19 immunization for 15 minutes without incident. He was provided with Vaccine Information Sheet and instruction to access the V-Safe system.   Mr. Berthelot was instructed to call 911 with any severe reactions post vaccine: Marland Kitchen Difficulty breathing  . Swelling of face and throat  . A fast heartbeat  . A bad rash all over body  . Dizziness and weakness   Immunizations Administered    Name Date Dose VIS Date Route   Pfizer COVID-19 Vaccine 01/07/2020  1:06 PM 0.3 mL 10/10/2018 Intramuscular   Manufacturer: [employer], [employer]   Lot: [id number]   NDC: [address]-3

## 2020-04-28 ENCOUNTER — Telehealth: Payer: Self-pay | Admitting: Internal Medicine

## 2020-04-28 NOTE — Telephone Encounter (Signed)
Called to Discuss with patient about Covid symptoms and the use of the monoclonal antibody infusion for those with mild to moderate Covid symptoms and at a high risk of hospitalization.     Pt qualifies for this infusion due to co-morbid conditions and/or a member of an at-risk group in accordance with the FDA Emergency Use Authorization. Pt has a 14yr hx of tobacco use. Symptom onset 9/9. Attempted to call back several times to schedule appointment with no answer.   Unable to reach pt. Myself or team member will attempt to reach back out.   Marcy Salvo, NP [facility]

## 2020-08-22 ENCOUNTER — Ambulatory Visit: Payer: BC Managed Care – PPO | Attending: Internal Medicine

## 2020-08-22 DIAGNOSIS — Z23 Encounter for immunization: Secondary | ICD-10-CM

## 2020-08-22 NOTE — Progress Notes (Signed)
   Covid-19 Vaccination Clinic  Name:  Taylor Patton    MRN: [id number] DOB: 1986/04/16  08/22/2020  [name] was observed post Covid-19 immunization for 15 minutes without incident. He was provided with Vaccine Information Sheet and instruction to access the V-Safe system.   [name] was instructed to call 911 with any severe reactions post vaccine: Marland Kitchen Difficulty breathing  . Swelling of face and throat  . A fast heartbeat  . A bad rash all over body  . Dizziness and weakness   Immunizations Administered    Name Date Dose VIS Date Route   Pfizer COVID-19 Vaccine 08/22/2020  2:17 PM 0.3 mL 06/04/2020 Intramuscular   Manufacturer: [employer], [employer]   Lot: [id number]   NDC: [address]-2
